# Patient Record
Sex: Female | Born: 1935 | Race: White | Hispanic: No
Health system: Southern US, Community
[De-identification: ages and names within clinical notes are randomized; demographics above are authoritative.]

---

## 2002-03-22 ENCOUNTER — Encounter: Admission: RE | Admit: 2002-03-22 | Discharge: 2002-03-22 | Payer: Self-pay | Admitting: Neurosurgery

## 2002-03-22 ENCOUNTER — Encounter: Payer: Self-pay | Admitting: Neurosurgery

## 2004-04-19 ENCOUNTER — Ambulatory Visit: Payer: Self-pay | Admitting: Family Medicine

## 2004-09-13 ENCOUNTER — Ambulatory Visit: Payer: Self-pay | Admitting: General Practice

## 2004-10-13 ENCOUNTER — Ambulatory Visit: Payer: Self-pay | Admitting: General Practice

## 2004-11-09 ENCOUNTER — Encounter: Payer: Self-pay | Admitting: General Practice

## 2004-11-20 ENCOUNTER — Encounter: Payer: Self-pay | Admitting: General Practice

## 2004-12-21 ENCOUNTER — Encounter: Payer: Self-pay | Admitting: General Practice

## 2005-01-21 ENCOUNTER — Encounter: Payer: Self-pay | Admitting: General Practice

## 2005-07-05 ENCOUNTER — Ambulatory Visit: Payer: Self-pay | Admitting: Family Medicine

## 2005-09-21 ENCOUNTER — Ambulatory Visit: Payer: Self-pay | Admitting: Family Medicine

## 2006-07-17 ENCOUNTER — Ambulatory Visit: Payer: Self-pay | Admitting: Family Medicine

## 2006-11-08 ENCOUNTER — Ambulatory Visit: Payer: Self-pay | Admitting: Family Medicine

## 2007-08-27 ENCOUNTER — Ambulatory Visit: Payer: Self-pay | Admitting: Family Medicine

## 2008-01-03 ENCOUNTER — Ambulatory Visit: Payer: Self-pay | Admitting: Specialist

## 2008-05-06 ENCOUNTER — Ambulatory Visit: Payer: Self-pay | Admitting: Specialist

## 2008-08-12 ENCOUNTER — Ambulatory Visit: Payer: Self-pay | Admitting: Vascular Surgery

## 2008-11-01 ENCOUNTER — Ambulatory Visit: Payer: Self-pay | Admitting: Internal Medicine

## 2008-11-17 ENCOUNTER — Ambulatory Visit: Payer: Self-pay | Admitting: Family Medicine

## 2009-07-18 ENCOUNTER — Ambulatory Visit: Payer: Self-pay | Admitting: Family Medicine

## 2009-08-04 ENCOUNTER — Ambulatory Visit: Payer: Self-pay | Admitting: Family Medicine

## 2010-02-03 ENCOUNTER — Ambulatory Visit: Payer: Self-pay | Admitting: Geriatric Medicine

## 2010-02-12 ENCOUNTER — Ambulatory Visit: Payer: Self-pay | Admitting: Gastroenterology

## 2011-02-01 ENCOUNTER — Ambulatory Visit: Payer: Self-pay | Admitting: Gastroenterology

## 2011-02-08 ENCOUNTER — Ambulatory Visit: Payer: Self-pay | Admitting: Geriatric Medicine

## 2011-02-11 ENCOUNTER — Emergency Department: Payer: Self-pay | Admitting: Internal Medicine

## 2011-02-18 ENCOUNTER — Emergency Department: Payer: Self-pay | Admitting: *Deleted

## 2011-04-24 ENCOUNTER — Emergency Department: Payer: Self-pay | Admitting: Emergency Medicine

## 2011-05-08 ENCOUNTER — Emergency Department: Payer: Self-pay | Admitting: Emergency Medicine

## 2011-05-13 ENCOUNTER — Emergency Department: Payer: Self-pay | Admitting: *Deleted

## 2011-06-05 ENCOUNTER — Inpatient Hospital Stay: Payer: Self-pay | Admitting: Internal Medicine

## 2011-06-05 LAB — COMPREHENSIVE METABOLIC PANEL
Albumin: 3.4 g/dL (ref 3.4–5.0)
Alkaline Phosphatase: 63 U/L (ref 50–136)
Anion Gap: 9 (ref 7–16)
BUN: 10 mg/dL (ref 7–18)
Bilirubin,Total: 0.3 mg/dL (ref 0.2–1.0)
Calcium, Total: 8.4 mg/dL — ABNORMAL LOW (ref 8.5–10.1)
Co2: 29 mmol/L (ref 21–32)
Creatinine: 0.85 mg/dL (ref 0.60–1.30)
EGFR (Non-African Amer.): >60
Glucose: 115 mg/dL — ABNORMAL HIGH (ref 65–99)
Osmolality: 287 (ref 275–301)
Potassium: 3.7 mmol/L (ref 3.5–5.1)
Sodium: 144 mmol/L (ref 136–145)
Total Protein: 6.4 g/dL (ref 6.4–8.2)

## 2011-06-05 LAB — TROPONIN I: Troponin-I: <0.02 ng/mL

## 2011-06-05 LAB — CBC
HCT: 33.9 % — ABNORMAL LOW (ref 35.0–47.0)
MCHC: 33.4 g/dL (ref 32.0–36.0)
Platelet: 187 10*3/uL (ref 150–440)
RDW: 15.5 % — ABNORMAL HIGH (ref 11.5–14.5)
WBC: 4.1 10*3/uL (ref 3.6–11.0)

## 2011-06-05 LAB — CK TOTAL AND CKMB (NOT AT ARMC): CK, Total: 54 U/L (ref 21–215)

## 2011-06-06 LAB — CBC WITH DIFFERENTIAL/PLATELET
Basophil #: 0 10*3/uL (ref 0.0–0.1)
Basophil %: 0.1 %
HCT: 34.6 % — ABNORMAL LOW (ref 35.0–47.0)
HGB: 11.2 g/dL — ABNORMAL LOW (ref 12.0–16.0)
Lymphocyte #: 0.6 10*3/uL — ABNORMAL LOW (ref 1.0–3.6)
Lymphocyte %: 17.4 %
MCV: 99 fL (ref 80–100)
Monocyte %: 1.2 %
Neutrophil #: 3 10*3/uL (ref 1.4–6.5)
RBC: 3.52 10*6/uL — ABNORMAL LOW (ref 3.80–5.20)
WBC: 3.7 10*3/uL (ref 3.6–11.0)

## 2011-06-06 LAB — BASIC METABOLIC PANEL
Calcium, Total: 8.6 mg/dL (ref 8.5–10.1)
Chloride: 106 mmol/L (ref 98–107)
Osmolality: 295 (ref 275–301)
Potassium: 4.6 mmol/L (ref 3.5–5.1)
Sodium: 146 mmol/L — ABNORMAL HIGH (ref 136–145)

## 2011-06-06 LAB — CK-MB: CK-MB: 2 ng/mL (ref 0.5–3.6)

## 2011-06-07 LAB — SODIUM: Sodium: 144 mmol/L (ref 136–145)

## 2011-06-07 LAB — HEMOGLOBIN: HGB: 10.5 g/dL — ABNORMAL LOW (ref 12.0–16.0)

## 2011-06-08 LAB — HEMOGLOBIN: HGB: 11.1 g/dL — ABNORMAL LOW (ref 12.0–16.0)

## 2011-06-08 LAB — HEMOGLOBIN A1C: Hemoglobin A1C: 6.1 % (ref 4.2–6.3)

## 2011-06-11 LAB — CULTURE, BLOOD (SINGLE)

## 2011-06-22 ENCOUNTER — Ambulatory Visit: Payer: Self-pay | Admitting: Family Medicine

## 2011-06-29 ENCOUNTER — Emergency Department: Payer: Self-pay | Admitting: Emergency Medicine

## 2011-06-29 LAB — CBC
HCT: 39 % (ref 35.0–47.0)
MCH: 32.6 pg (ref 26.0–34.0)
MCV: 99 fL (ref 80–100)
Platelet: 239 10*3/uL (ref 150–440)
RBC: 3.95 10*6/uL (ref 3.80–5.20)

## 2011-06-29 LAB — COMPREHENSIVE METABOLIC PANEL
Alkaline Phosphatase: 52 U/L (ref 50–136)
Anion Gap: 9 (ref 7–16)
BUN: 11 mg/dL (ref 7–18)
Bilirubin,Total: 0.4 mg/dL (ref 0.2–1.0)
Calcium, Total: 8.8 mg/dL (ref 8.5–10.1)
Co2: 32 mmol/L (ref 21–32)
EGFR (African American): >60
EGFR (Non-African Amer.): 56 — ABNORMAL LOW
Glucose: 88 mg/dL (ref 65–99)
Potassium: 4.1 mmol/L (ref 3.5–5.1)
SGOT(AST): 19 U/L (ref 15–37)
SGPT (ALT): 17 U/L

## 2011-08-17 ENCOUNTER — Emergency Department: Payer: Self-pay | Admitting: Emergency Medicine

## 2012-02-06 ENCOUNTER — Emergency Department: Payer: Self-pay | Admitting: Emergency Medicine

## 2012-02-06 LAB — URINALYSIS, COMPLETE
Bilirubin,UR: NEGATIVE
Glucose,UR: NEGATIVE mg/dL (ref 0–75)
Ketone: NEGATIVE
Nitrite: NEGATIVE
Ph: 6 (ref 4.5–8.0)
Protein: 100
RBC,UR: 63 /HPF (ref 0–5)
Specific Gravity: 1.006 (ref 1.003–1.030)
Squamous Epithelial: 10
WBC UR: 7190 /HPF (ref 0–5)

## 2012-02-15 ENCOUNTER — Emergency Department: Payer: Self-pay | Admitting: Emergency Medicine

## 2012-03-07 ENCOUNTER — Emergency Department: Payer: Self-pay | Admitting: Emergency Medicine

## 2012-03-07 LAB — COMPREHENSIVE METABOLIC PANEL
Albumin: 2.2 g/dL — ABNORMAL LOW (ref 3.4–5.0)
Alkaline Phosphatase: 137 U/L — ABNORMAL HIGH (ref 50–136)
Anion Gap: 10 (ref 7–16)
BUN: 9 mg/dL (ref 7–18)
Bilirubin,Total: 0.3 mg/dL (ref 0.2–1.0)
Calcium, Total: 8.5 mg/dL (ref 8.5–10.1)
Chloride: 99 mmol/L (ref 98–107)
Co2: 30 mmol/L (ref 21–32)
Creatinine: 0.91 mg/dL (ref 0.60–1.30)
EGFR (African American): >60
EGFR (Non-African Amer.): >60
Glucose: 105 mg/dL — ABNORMAL HIGH (ref 65–99)
Osmolality: 277 (ref 275–301)
Potassium: 3.6 mmol/L (ref 3.5–5.1)
SGOT(AST): 26 U/L (ref 15–37)
SGPT (ALT): 21 U/L (ref 12–78)
Sodium: 139 mmol/L (ref 136–145)
Total Protein: 6.9 g/dL (ref 6.4–8.2)

## 2012-03-07 LAB — CK TOTAL AND CKMB (NOT AT ARMC)
CK, Total: 20 U/L — ABNORMAL LOW (ref 21–215)
CK-MB: <0.5 ng/mL — ABNORMAL LOW (ref 0.5–3.6)

## 2012-03-07 LAB — CBC
HCT: 31.3 % — ABNORMAL LOW (ref 35.0–47.0)
HGB: 10.7 g/dL — ABNORMAL LOW (ref 12.0–16.0)
MCH: 32 pg (ref 26.0–34.0)
MCHC: 34.3 g/dL (ref 32.0–36.0)
MCV: 93 fL (ref 80–100)
Platelet: 505 10*3/uL — ABNORMAL HIGH (ref 150–440)
RBC: 3.36 10*6/uL — ABNORMAL LOW (ref 3.80–5.20)
RDW: 13.8 % (ref 11.5–14.5)
WBC: 6.2 10*3/uL (ref 3.6–11.0)

## 2012-03-07 LAB — URINALYSIS, COMPLETE
Bilirubin,UR: NEGATIVE
Glucose,UR: NEGATIVE mg/dL (ref 0–75)
Hyaline Cast: 2
Ketone: NEGATIVE
Nitrite: NEGATIVE
Ph: 6 (ref 4.5–8.0)
Protein: NEGATIVE
RBC,UR: 7 /HPF (ref 0–5)
Specific Gravity: 1.009 (ref 1.003–1.030)
Squamous Epithelial: 4
WBC UR: 702 /HPF (ref 0–5)

## 2012-03-07 LAB — PRO B NATRIURETIC PEPTIDE: B-Type Natriuretic Peptide: 1361 pg/mL — ABNORMAL HIGH (ref 0–450)

## 2012-03-07 LAB — TROPONIN I: Troponin-I: <0.02 ng/mL

## 2012-03-09 LAB — URINE CULTURE

## 2012-03-13 LAB — CULTURE, BLOOD (SINGLE)

## 2012-05-02 ENCOUNTER — Emergency Department: Payer: Self-pay | Admitting: Internal Medicine

## 2012-05-28 ENCOUNTER — Ambulatory Visit: Payer: Self-pay | Admitting: Pain Medicine

## 2012-05-31 ENCOUNTER — Ambulatory Visit: Payer: Self-pay | Admitting: Pain Medicine

## 2012-06-18 ENCOUNTER — Ambulatory Visit: Payer: Self-pay | Admitting: Pain Medicine

## 2012-06-19 ENCOUNTER — Emergency Department: Payer: Self-pay | Admitting: Emergency Medicine

## 2012-06-26 ENCOUNTER — Ambulatory Visit: Payer: Self-pay | Admitting: Pain Medicine

## 2012-07-19 ENCOUNTER — Ambulatory Visit: Payer: Self-pay | Admitting: Pain Medicine

## 2012-08-16 IMAGING — CT CT HEAD WITHOUT CONTRAST
2 series · 15 of 30 positions shown, 19 images · non-contrast
Comparison: none

REASON FOR EXAM: FALL  HIT HEAD
COMMENTS:   May transport without cardiac monitor

PROCEDURE:     CT  - CT HEAD WITHOUT CONTRAST  - February 18, 2011  [DATE]
RESULT:     Comparison:  02/11/2011
TECHNIQUE: Multiple axial images from the foramen magnum to the vertex were
obtained without IV contrast.

[Series 2: without · axial · non-contrast · 0.43mm/px · z∈[-200,-75]mm · 13 of 31 slices shown, 17 images]
[im 3/31  brain]
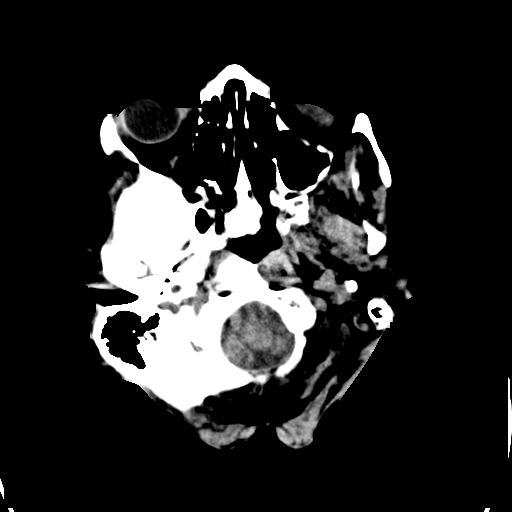
[im 3/31  bone]
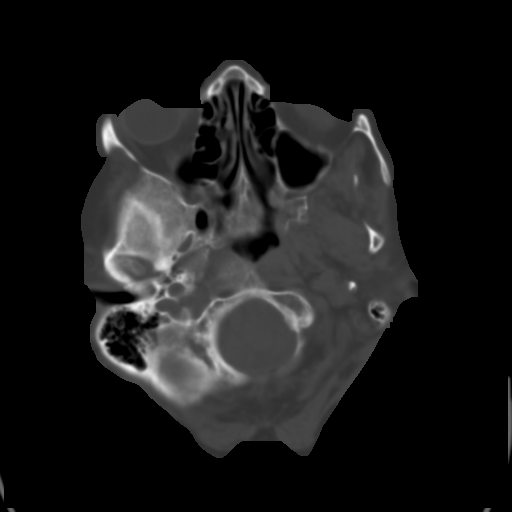
[im 5/31  brain]
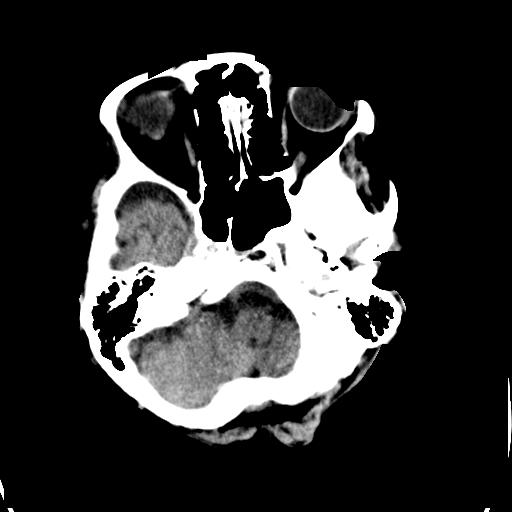
[im 7/31  brain]
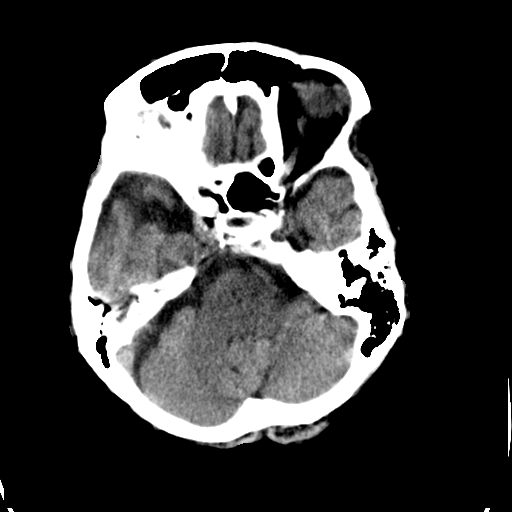
[im 9/31  brain]
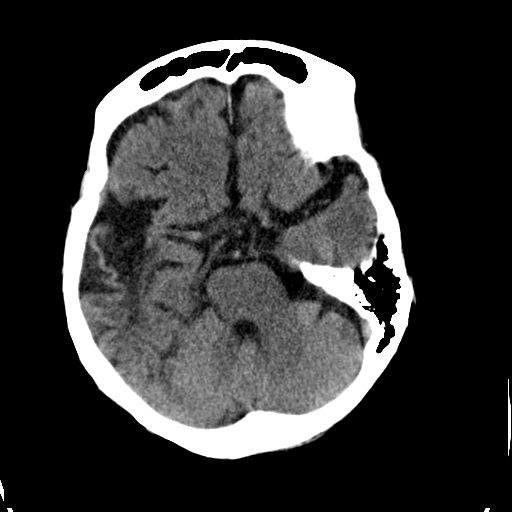
[im 11/31  brain]
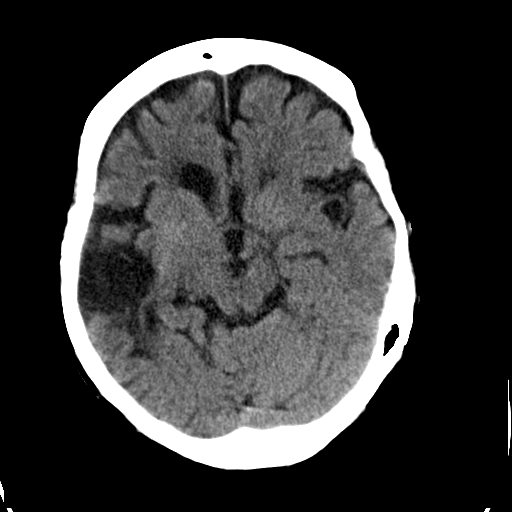
[im 11/31  bone]
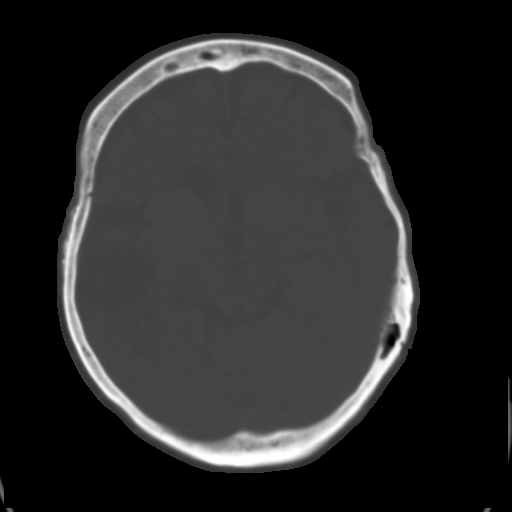
[im 13/31  brain]
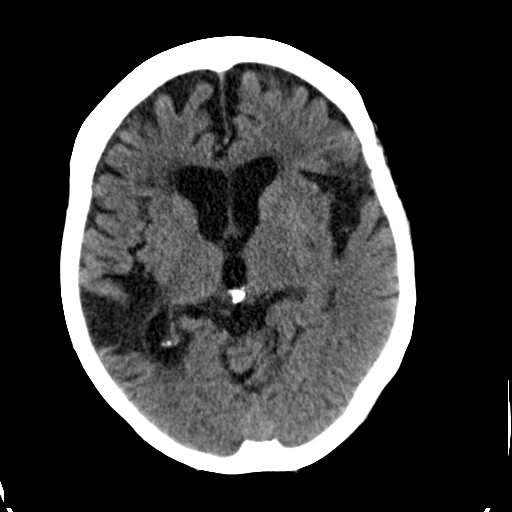
[im 16/31  brain]
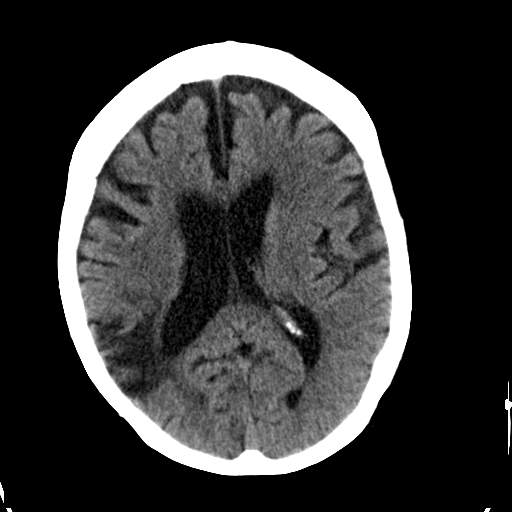
[im 18/31  brain]
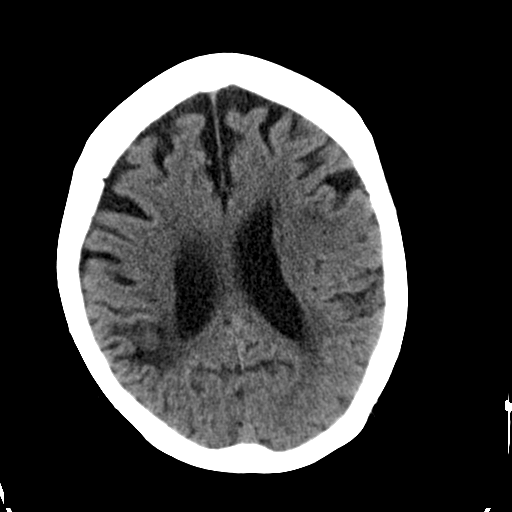
[im 20/31  brain]
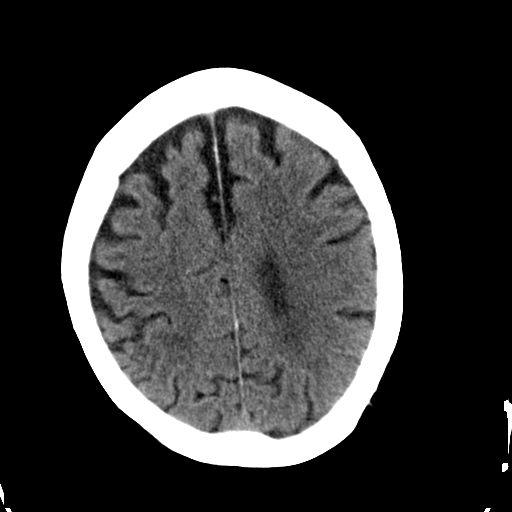
[im 20/31  bone]
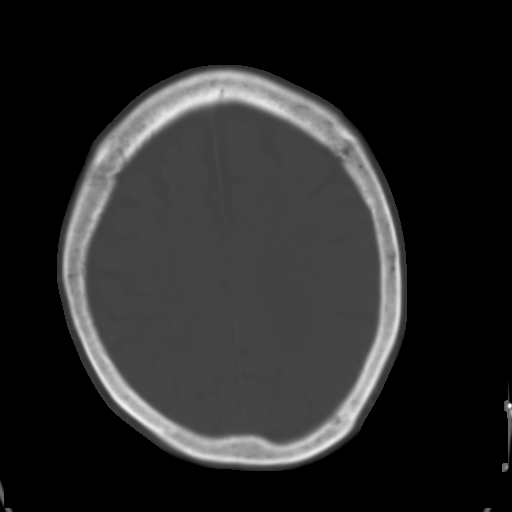
[im 22/31  brain]
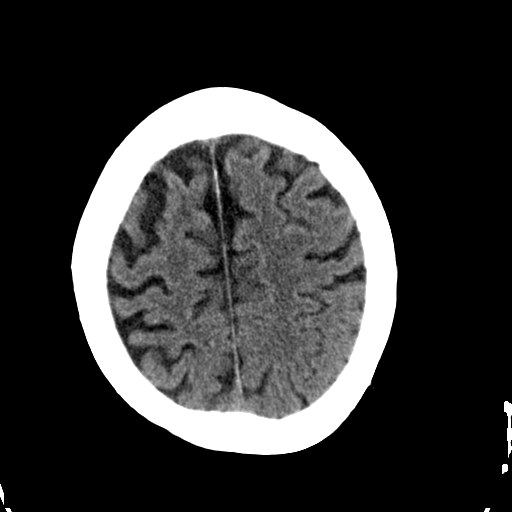
[im 24/31  brain]
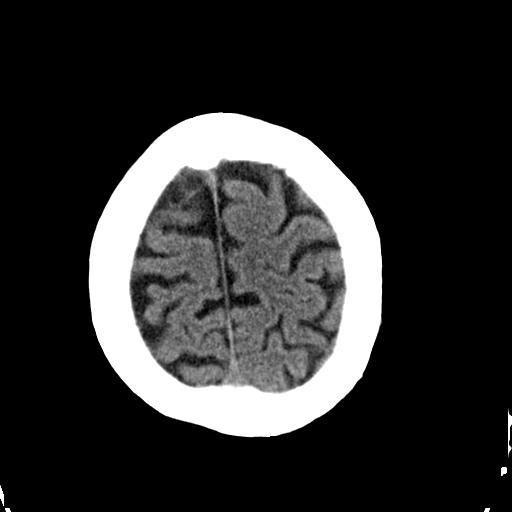
[im 26/31  brain]
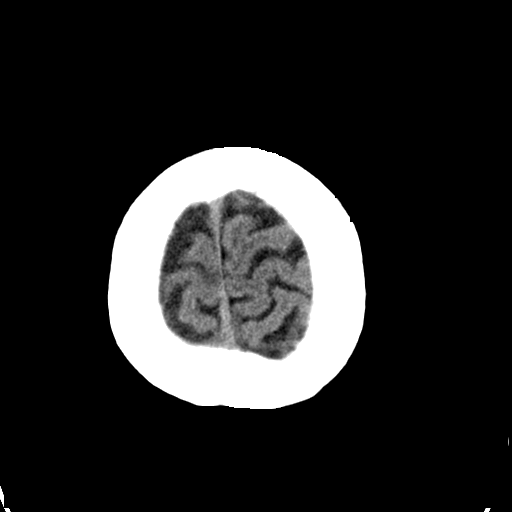
[im 28/31  brain]
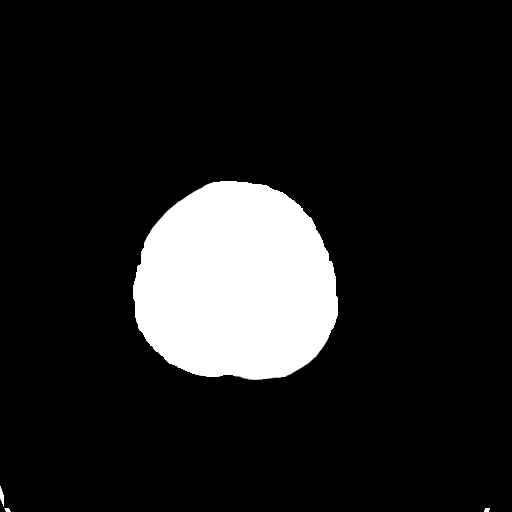
[im 28/31  bone]
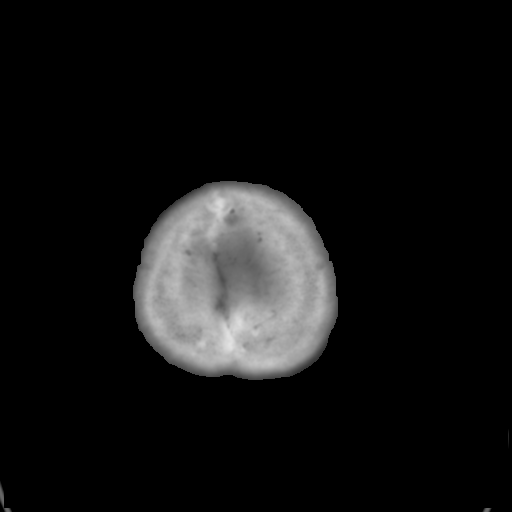

[Series 3: bone · axial · 0.43mm/px · z∈[-200,-180]mm · 2 of 31 slices shown]
[im 3/31  bone]
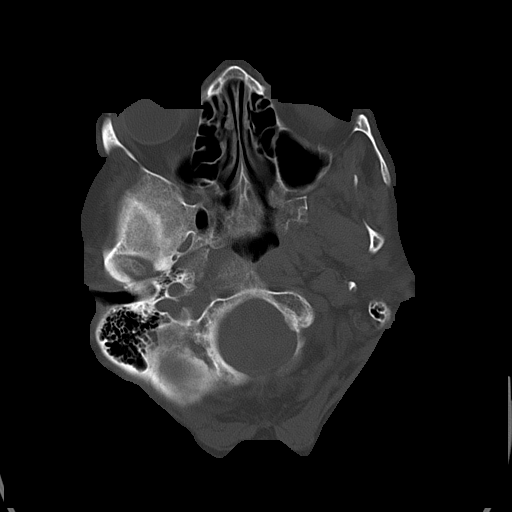
[im 7/31  bone]
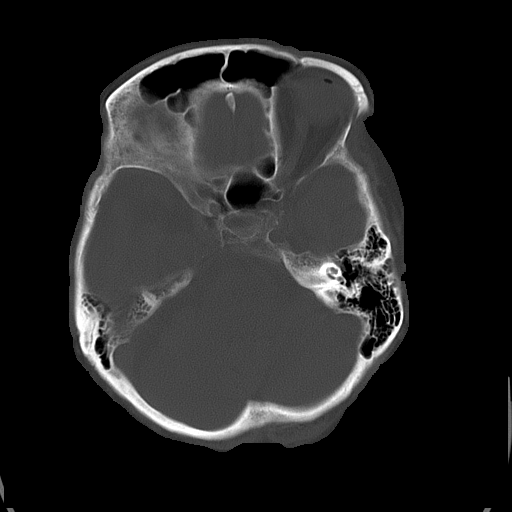

[15 of 30 positions shown; findings below may reference images not displayed]

FINDINGS: There is no evidence of mass effect, midline shift, or extra-axial fluid
collections.  There is no evidence of a space-occupying lesion or
intracranial hemorrhage. There is no evidence of a cortical-based area of
acute infarction. There is an old right temporoparietal infarct with
encephalomalacia. There is an old left basal ganglia lacunar infarct. There
is generalized cerebral atrophy. There is periventricular white matter low
attenuation likely secondary to microangiopathy.

The ventricles and sulci are appropriate for the patient's age. The basal
cisterns are patent.

Visualized portions of the orbits are unremarkable. The visualized portions
of the paranasal sinuses and mastoid air cells are unremarkable.

The osseous structures are unremarkable.
IMPRESSION: No acute intracranial process.

## 2012-12-02 IMAGING — CR DG CHEST 2V
1 series · 2 of 2 positions shown · non-contrast
Comparison: none

REASON FOR EXAM: SOB
COMMENTS:

PROCEDURE:     DXR - DXR CHEST PA (OR AP) AND LATERAL  - June 06, 2011  [DATE]
RESULT:     Comparison: 06/05/2011

[Series 1: ap · 0.17mm/px · 2 of 2 slices shown]
[im 1/2]
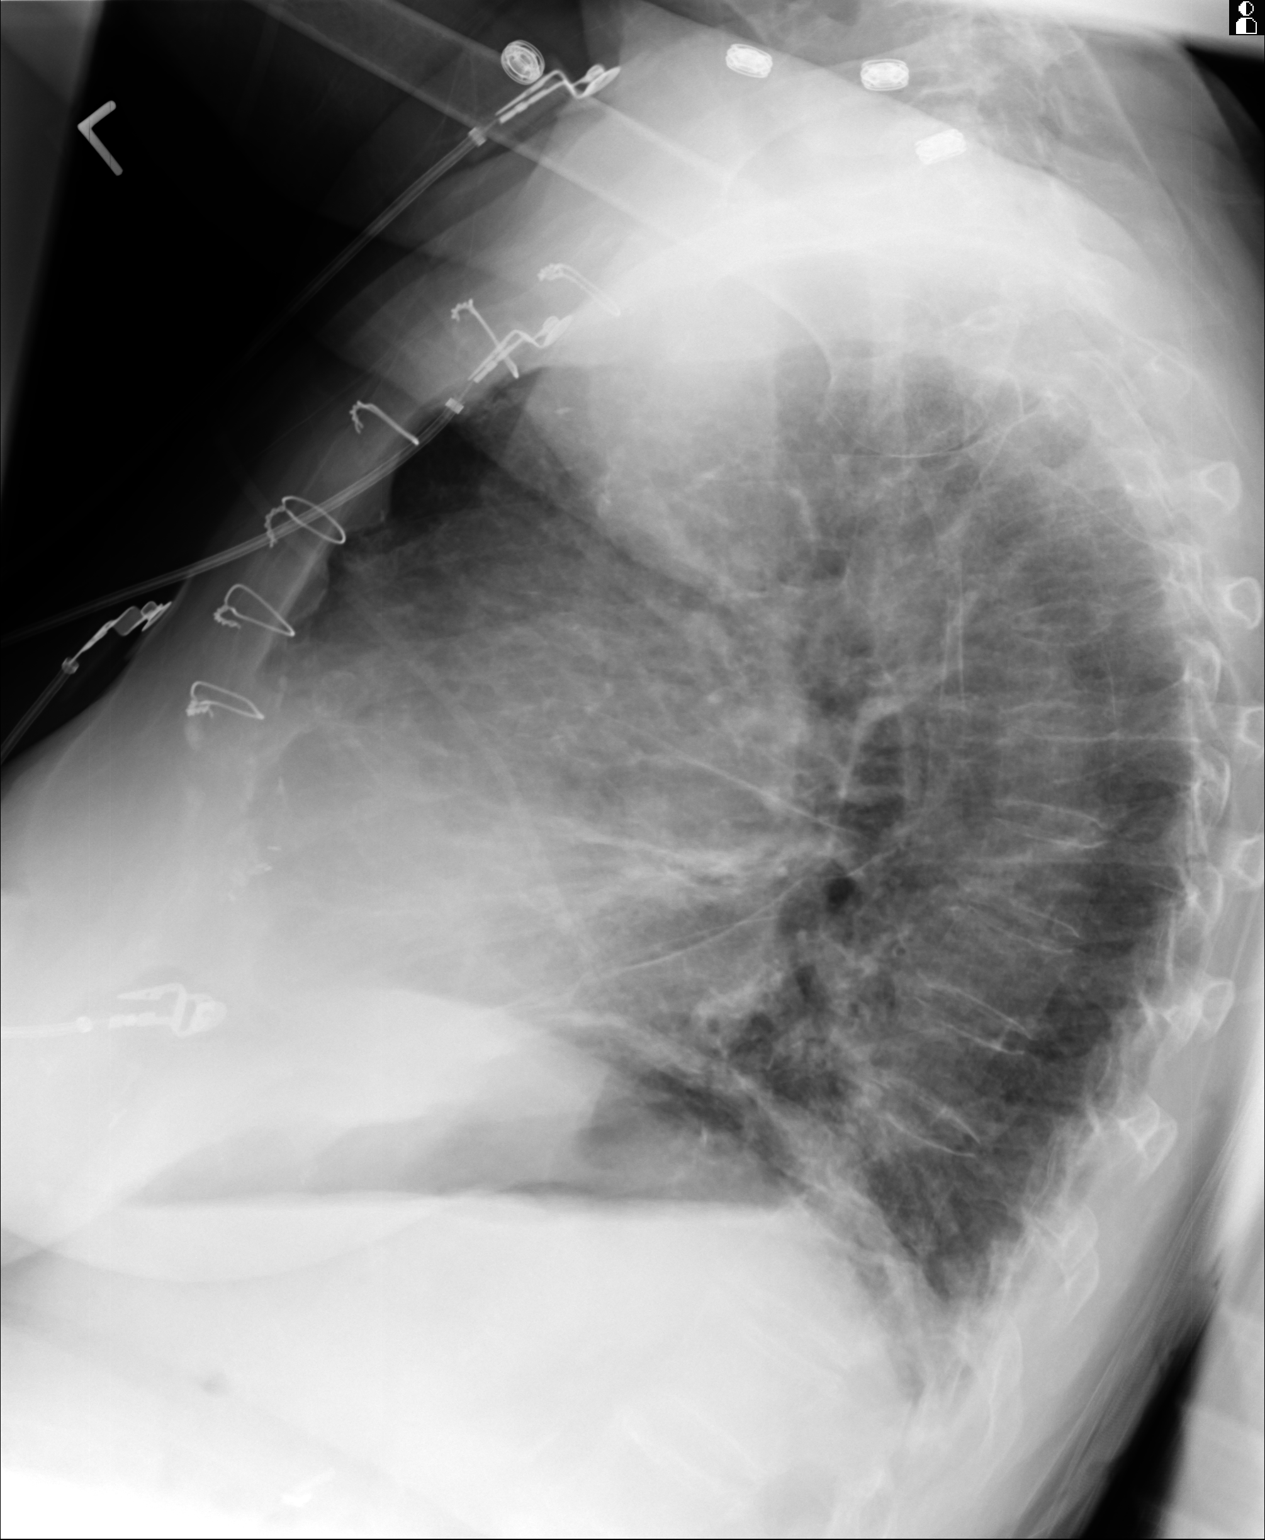
[im 2/2]
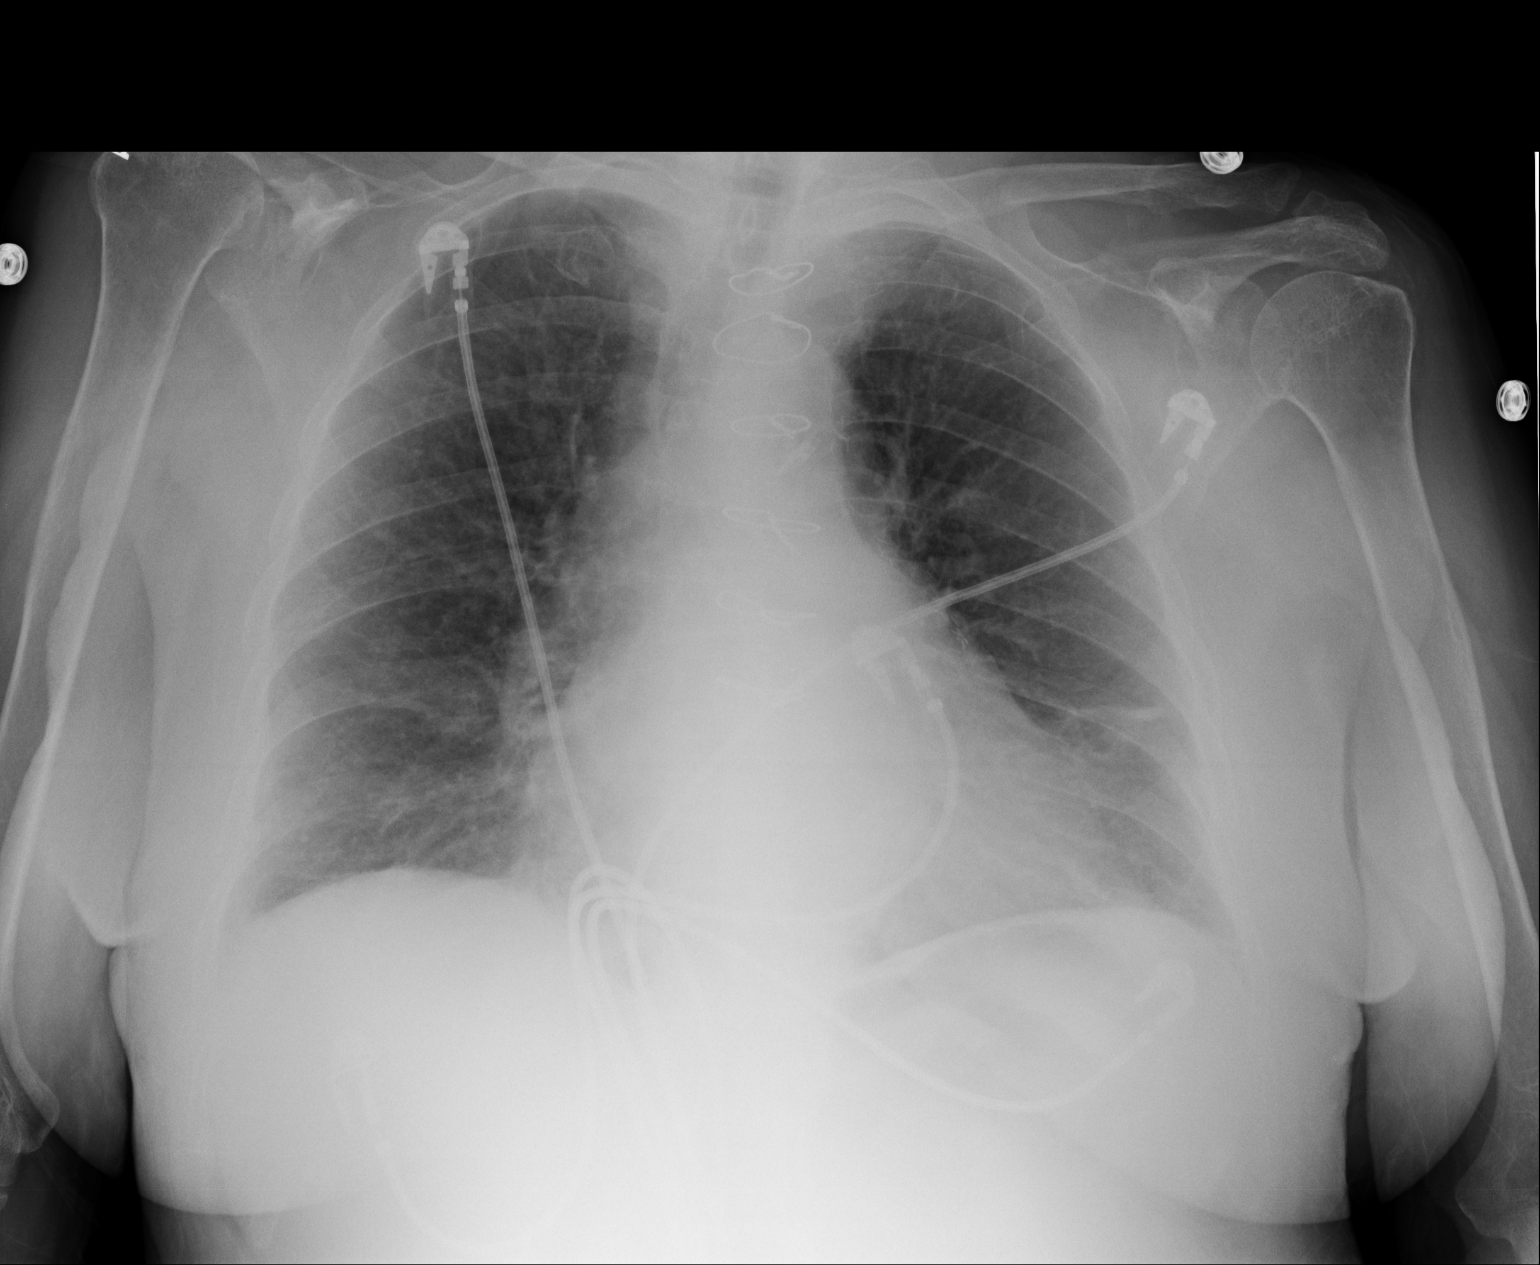

[2 of 2 positions shown; findings below may reference images not displayed]

FINDINGS: PA and lateral chest radiographs are provided. There is mild bilateral
interstitial prominence. There is no focal parenchymal opacity, pleural
effusion, or pneumothorax. The heart and mediastinum are unremarkable.  The
osseous structures are unremarkable.
IMPRESSION: No acute disease of the chest.

## 2012-12-03 ENCOUNTER — Ambulatory Visit: Payer: Self-pay

## 2012-12-25 IMAGING — CR DG CHEST 2V
1 series · 2 of 2 positions shown · non-contrast
Comparison: none

REASON FOR EXAM: SOB
COMMENTS:

PROCEDURE:     DXR - DXR CHEST PA (OR AP) AND LATERAL  - June 29, 2011  [DATE]
RESULT:     Comparison: None

[Series 1: w chest pa · 0.14mm/px · 2 of 2 slices shown]
[im 1/2]
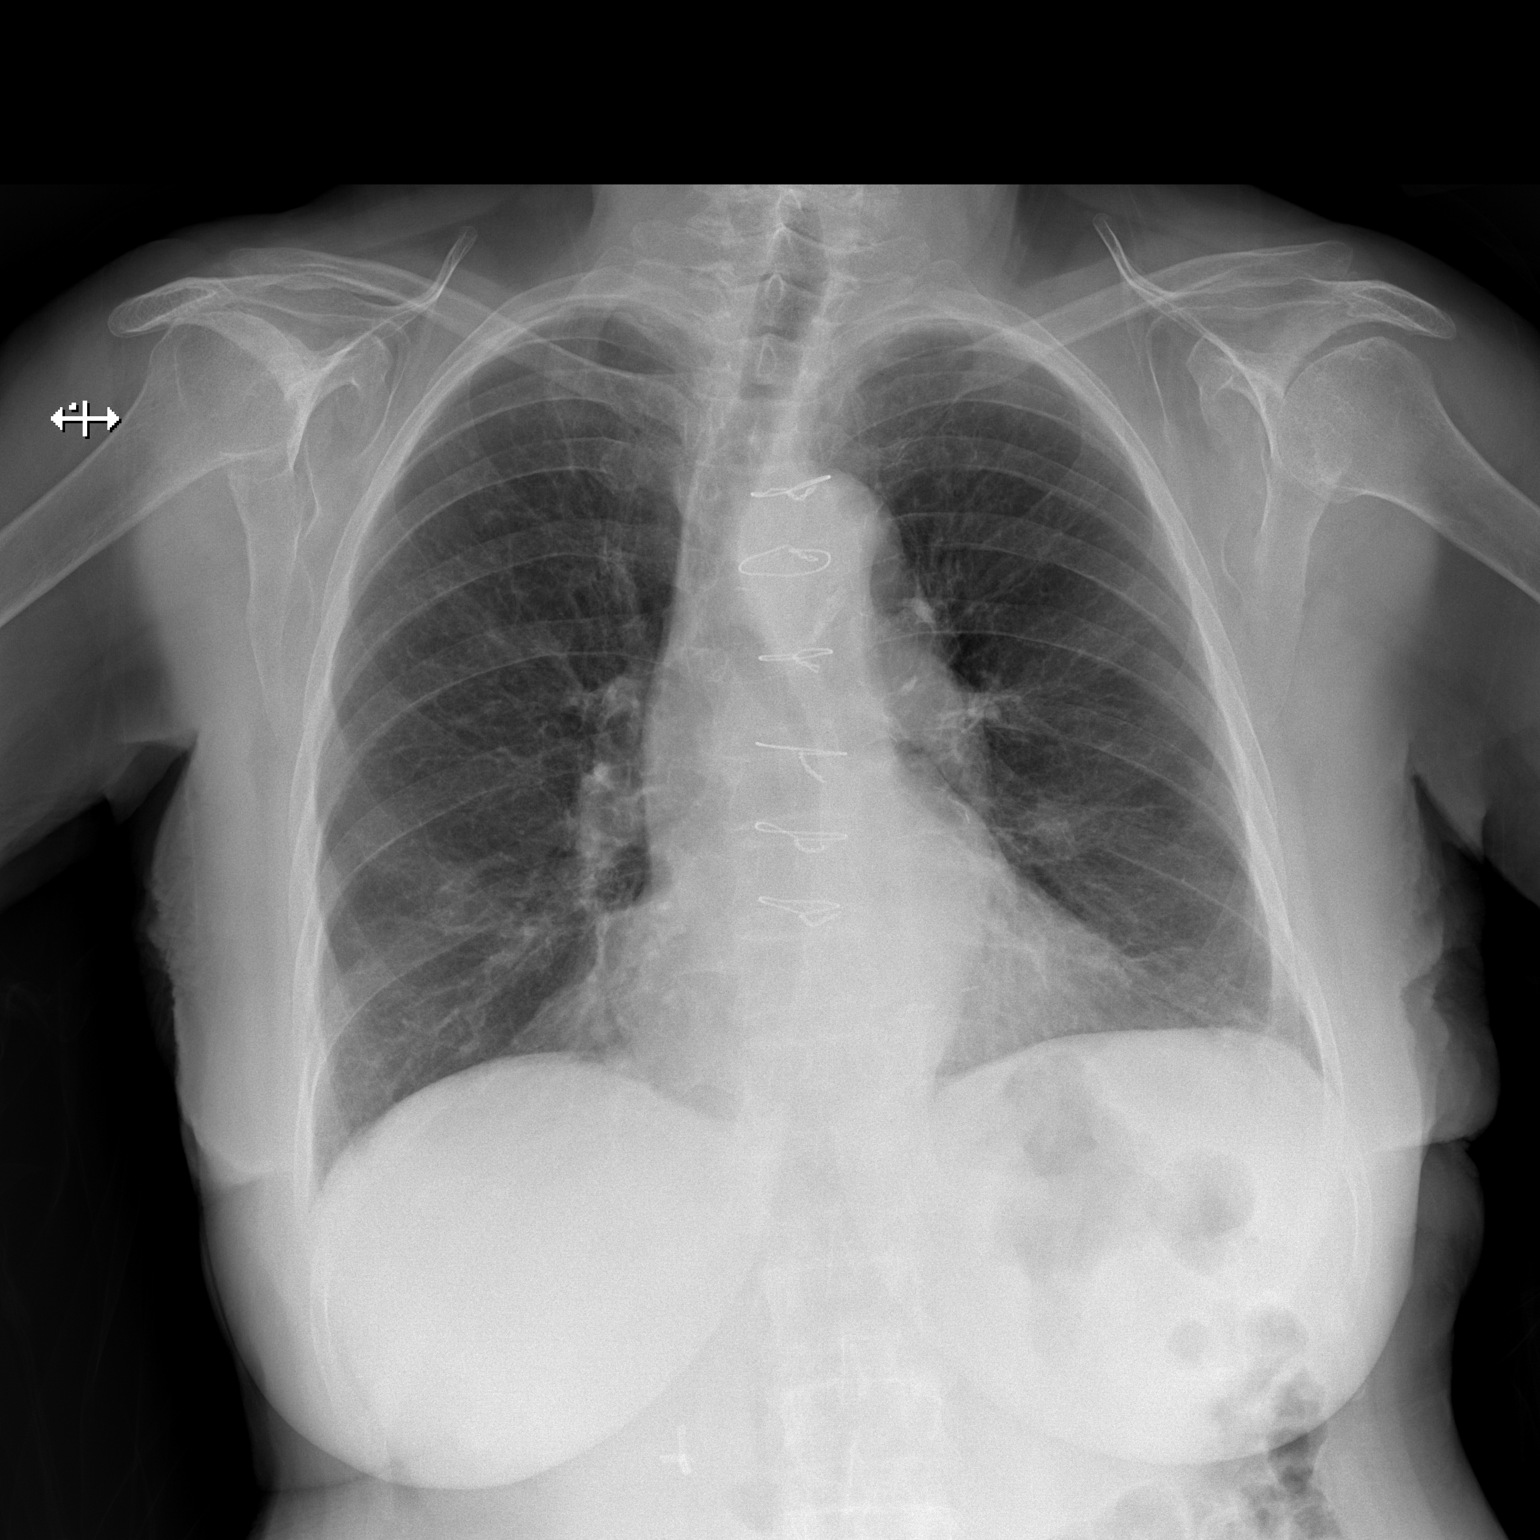
[im 2/2]
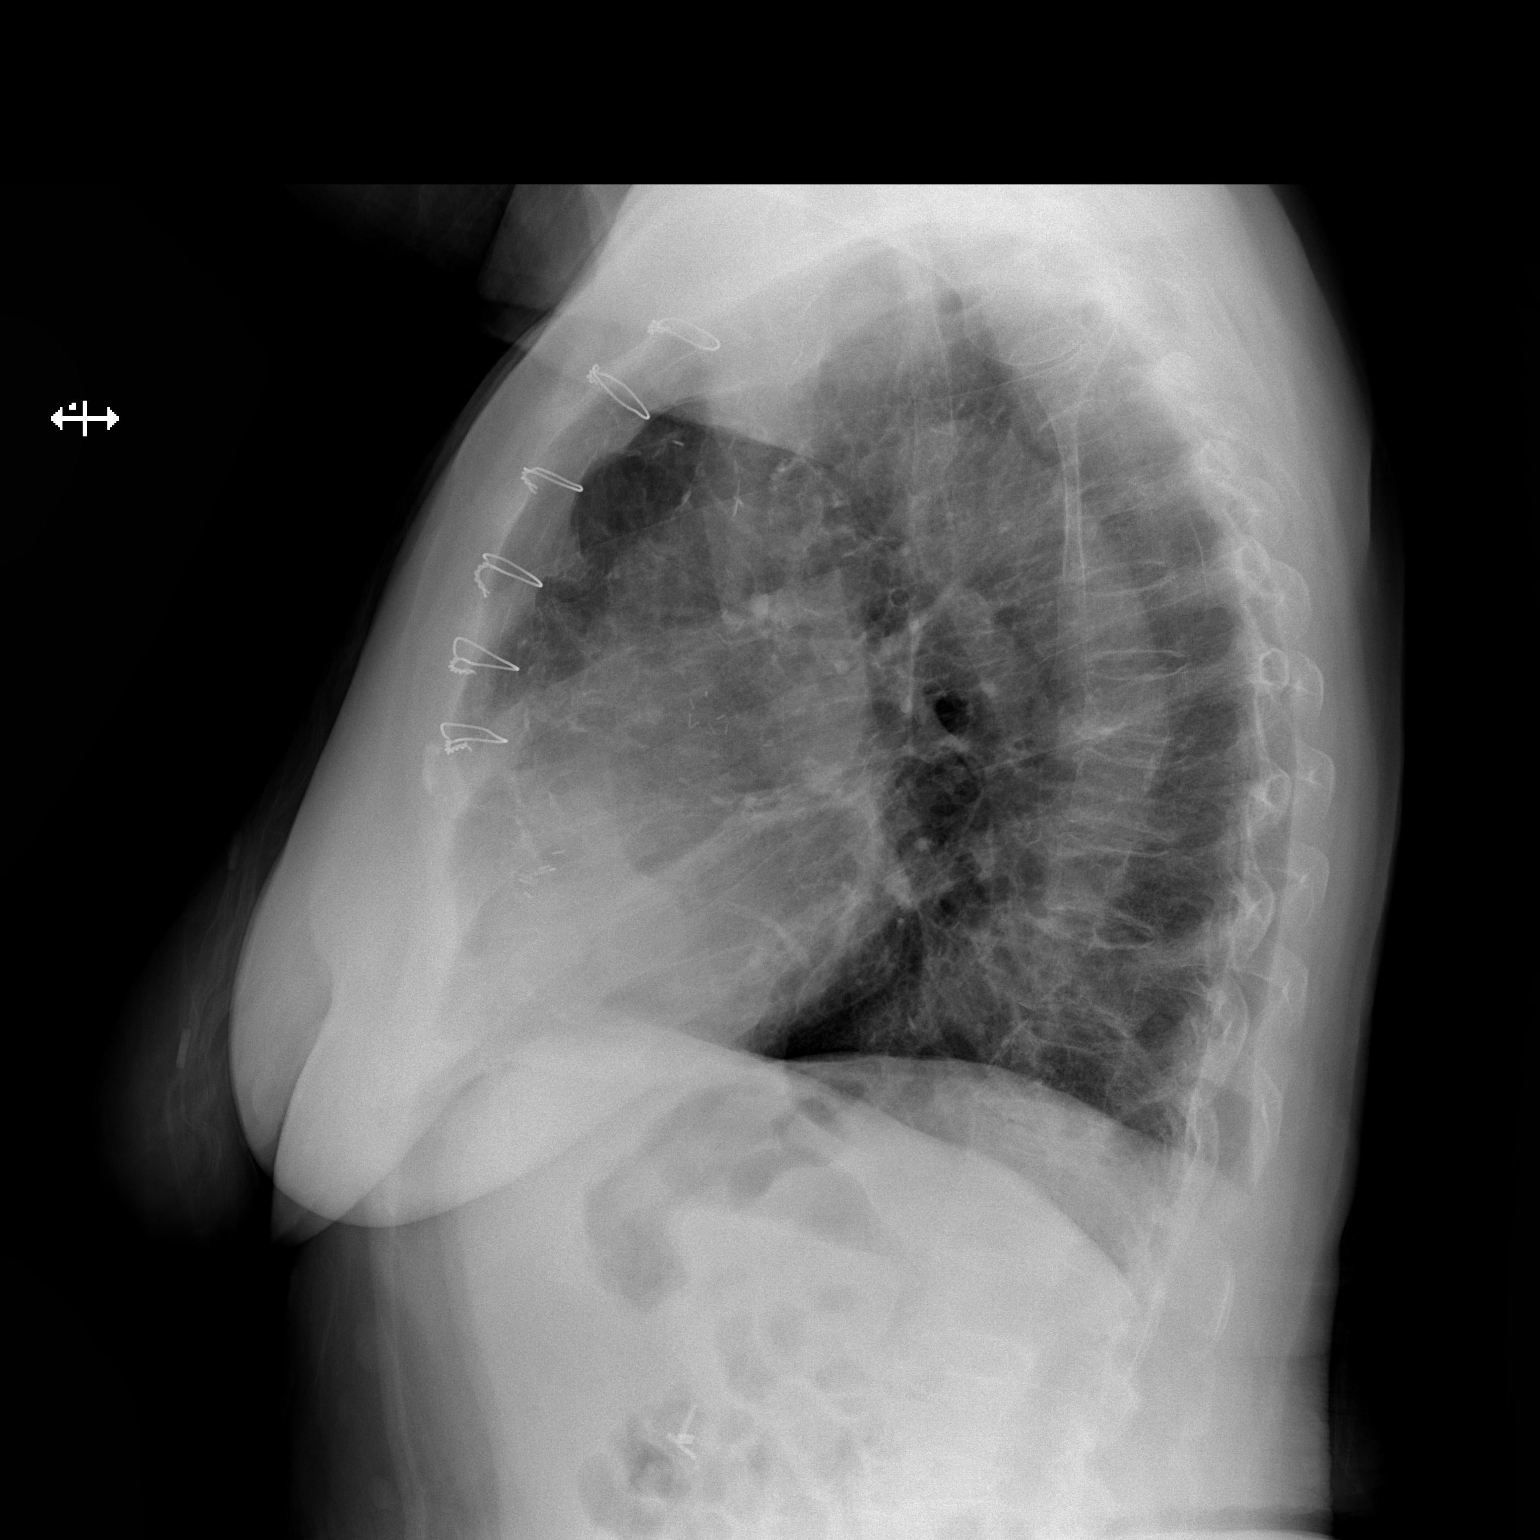

[2 of 2 positions shown; findings below may reference images not displayed]

FINDINGS: PA and lateral chest radiographs are provided.  There is no focal
parenchymal opacity, pleural effusion, or pneumothorax. The heart and
mediastinum are unremarkable. Prior median sternotomy. The osseous
structures are unremarkable.
IMPRESSION: No acute disease of the che[REDACTED]

## 2013-04-19 ENCOUNTER — Emergency Department: Payer: Self-pay | Admitting: Emergency Medicine

## 2013-04-19 LAB — CBC
HCT: 35.9 % (ref 35.0–47.0)
MCH: 31.8 pg (ref 26.0–34.0)
MCHC: 34 g/dL (ref 32.0–36.0)
MCV: 94 fL (ref 80–100)
Platelet: 319 10*3/uL (ref 150–440)
WBC: 5.4 10*3/uL (ref 3.6–11.0)

## 2013-04-19 LAB — COMPREHENSIVE METABOLIC PANEL
Albumin: 2.9 g/dL — ABNORMAL LOW (ref 3.4–5.0)
Alkaline Phosphatase: 89 U/L
Anion Gap: 4 — ABNORMAL LOW (ref 7–16)
Bilirubin,Total: 0.2 mg/dL (ref 0.2–1.0)
Chloride: 102 mmol/L (ref 98–107)
Creatinine: 1.06 mg/dL (ref 0.60–1.30)
EGFR (Non-African Amer.): 50 — ABNORMAL LOW
Osmolality: 276 (ref 275–301)
SGOT(AST): 14 U/L — ABNORMAL LOW (ref 15–37)
Total Protein: 7 g/dL (ref 6.4–8.2)

## 2013-04-19 LAB — TROPONIN I: Troponin-I: <0.02 ng/mL

## 2013-04-19 LAB — CK TOTAL AND CKMB (NOT AT ARMC): CK, Total: 40 U/L (ref 21–215)

## 2013-05-05 ENCOUNTER — Emergency Department: Payer: Self-pay | Admitting: Emergency Medicine

## 2013-05-05 LAB — URINALYSIS, COMPLETE
Bilirubin,UR: NEGATIVE
Glucose,UR: NEGATIVE mg/dL (ref 0–75)
Ketone: NEGATIVE
Nitrite: POSITIVE
Ph: 6 (ref 4.5–8.0)
Protein: NEGATIVE
RBC,UR: 6 /HPF (ref 0–5)
Specific Gravity: 1.008 (ref 1.003–1.030)
Squamous Epithelial: <1
WBC UR: 691 /HPF (ref 0–5)

## 2013-05-05 LAB — CBC
HCT: 38.3 % (ref 35.0–47.0)
HGB: 12.6 g/dL (ref 12.0–16.0)
MCHC: 32.8 g/dL (ref 32.0–36.0)
MCV: 94 fL (ref 80–100)
RBC: 4.09 10*6/uL (ref 3.80–5.20)
RDW: 13.8 % (ref 11.5–14.5)
WBC: 4.9 10*3/uL (ref 3.6–11.0)

## 2013-05-05 LAB — COMPREHENSIVE METABOLIC PANEL
Albumin: 2.9 g/dL — ABNORMAL LOW (ref 3.4–5.0)
Alkaline Phosphatase: 78 U/L
Anion Gap: 0 — ABNORMAL LOW (ref 7–16)
Chloride: 100 mmol/L (ref 98–107)
Creatinine: 1.15 mg/dL (ref 0.60–1.30)
EGFR (African American): 52 — ABNORMAL LOW
EGFR (Non-African Amer.): 45 — ABNORMAL LOW
Osmolality: 270 (ref 275–301)
Potassium: 4.5 mmol/L (ref 3.5–5.1)
Sodium: 136 mmol/L (ref 136–145)

## 2013-05-05 LAB — TSH: Thyroid Stimulating Horm: 1.81 u[IU]/mL

## 2013-05-05 LAB — MAGNESIUM: Magnesium: 1.8 mg/dL

## 2013-07-23 ENCOUNTER — Emergency Department: Payer: Self-pay | Admitting: Emergency Medicine

## 2013-07-23 LAB — BASIC METABOLIC PANEL
Anion Gap: 5 — ABNORMAL LOW (ref 7–16)
BUN: 18 mg/dL (ref 7–18)
Calcium, Total: 8.6 mg/dL (ref 8.5–10.1)
Chloride: 103 mmol/L (ref 98–107)
Co2: 31 mmol/L (ref 21–32)
Creatinine: 1.54 mg/dL — ABNORMAL HIGH (ref 0.60–1.30)
GFR CALC AF AMER: 37 — AB
GFR CALC NON AF AMER: 32 — AB
Glucose: 119 mg/dL — ABNORMAL HIGH (ref 65–99)
Osmolality: 281 (ref 275–301)
POTASSIUM: 4.3 mmol/L (ref 3.5–5.1)
SODIUM: 139 mmol/L (ref 136–145)

## 2013-07-23 LAB — CBC
HCT: 38.1 % (ref 35.0–47.0)
HGB: 12.6 g/dL (ref 12.0–16.0)
MCH: 32.2 pg (ref 26.0–34.0)
MCHC: 33.1 g/dL (ref 32.0–36.0)
MCV: 97 fL (ref 80–100)
PLATELETS: 151 10*3/uL (ref 150–440)
RBC: 3.92 10*6/uL (ref 3.80–5.20)
RDW: 16.3 % — ABNORMAL HIGH (ref 11.5–14.5)
WBC: 4.7 10*3/uL (ref 3.6–11.0)

## 2013-08-21 DEATH — deceased

## 2014-09-14 NOTE — H&P (Signed)
PATIENT NAME:  Erin Reese, COLLEDGE MR#:  [id number] DATE OF BIRTH:  19-Dec-1930  DATE OF ADMISSION:  06/05/2011  REFERRING PHYSICIAN: Dr. Adela Glimpse FAMILY PHYSICIAN: Dr. Sullivan Lone  REASON FOR ADMISSION: Acute respiratory failure with hemoptysis.   HISTORY OF PRESENT ILLNESS: The patient is a 81 year old female followed by Dr. Sullivan Lone with a history of chronic obstructive pulmonary disease, coronary artery disease status post CABG, benign hypertension, and early dementia. Presents to the Emergency Room with acute onset of shortness of breath and cough. Had hemoptysis last night. In the Emergency Room, the patient was found to be hypotensive and hypoxic. She was started on oxygen. In the Emergency Room, she was given SVNs with IV steroids and IV antibiotics. Initial chest x-ray was nondiagnostic. She is now admitted for further evaluation. Patient states that she quit smoking over 10 years ago.   PAST MEDICAL HISTORY:  1. Chronic obstructive pulmonary disease with remote history of tobacco abuse.  2. Atherosclerotic cardiovascular disease status post MI.  3. Status post coronary artery bypass graft. 4. Ischemic cardiomyopathy.  5. Benign hypertension.  6. Depression.  7. Early dementia.  8. Chronic palpitations.  9. Osteoarthritis.  10. Gastroesophageal reflux disease.   MEDICATIONS:  1. Zoloft 100 mg p.o. daily.  2. Toprol-XL 100 mg p.o. daily. 3. Nitro-Dur 0.8 mg/h topically daily. 4. Aspirin 325 mg p.o. daily.  5. Nexium 40 mg p.o. daily.  6. Lasix 20 mg p.o. daily.  7. Trazodone 50 mg p.o. at bedtime.  8. MiraLax 17 grams p.o. daily.  9. Norco 5/325, 1 to 2 p.o. every six hours p.r.n. pain.  10. Zestril 5 mg p.o. daily.  11. Claritin 10 mg p.o. daily.  12. Klonopin 0.5 mg p.o. 3 times daily.  13. Flonase 2 puffs in each nostril daily.  14. Aricept 5 mg p.o. daily.  15. Zocor 10 mg p.o. daily.  16. Lyrica 50 mg p.o. b.i.d.  17. BuSpar 15 mg p.o. b.i.d.  18. Ventolin 2 puffs q.4-6h.  p.r.n. shortness of breath.   ALLERGIES: Sulfa, codeine, penicillin, Relafen, Lodine, and E-Mycin.   SOCIAL HISTORY: Patient quit smoking in 1990. No history of alcohol abuse.   FAMILY HISTORY: Positive for hypertension, coronary artery disease, and stroke.    REVIEW OF SYSTEMS: CONSTITUTIONAL: No fever or change in weight. EYES: No blurred or double vision. No glaucoma. ENT: No tinnitus or hearing loss. No nasal discharge or bleeding. No difficulty swallowing. RESPIRATORY: No painful respiration. No history of tuberculosis. CARDIOVASCULAR: No chest pain or orthopnea. No syncope. GASTROINTESTINAL: No nausea, vomiting, or diarrhea. No abdominal pain. No change in bowel habits. GENITOURINARY: No dysuria, hematuria. No incontinence. ENDOCRINE: No polyuria or polydipsia. No heat or cold intolerance. HEMATOLOGIC: Patient denies anemia, easy bruising. LYMPHATIC: No swollen glands. MUSCULOSKELETAL: Patient denies pain in her neck, back, shoulders, knees, or hips. No gout. NEUROLOGIC: No numbness or migraines. Denies stroke or seizures. PSYCH: Patient denies anxiety, insomnia, or depression.   PHYSICAL EXAMINATION:  GENERAL: Patient is chronically ill-appearing, in moderate respiratory distress.   VITAL SIGNS: Vital signs are currently remarkable for a blood pressure of 97/52 with a heart rate of 76 and a respiratory rate of 18. She is afebrile.   HEENT: Normocephalic, atraumatic. Pupils equally round and reactive to light and accommodation. Extraocular movements are intact. Sclerae are not icteric. Conjunctivae are clear. Oropharynx is dry but clear.   NECK: Supple without jugular venous distention or bruits. No adenopathy or thyromegaly is noted.   LUNGS: Scattered wheezes and rhonchi.  No dullness. No rales. Respiratory effort is mildly increased.   CARDIAC: Regular rate and rhythm. Normal S1 and S2. No significant rubs, murmurs, or gallops. PMI is nondisplaced. Chest wall is nontender.   ABDOMEN:  Soft, nontender, and normoactive bowel sounds. No organomegaly, masses were appreciated. No hernias or bruits were noted.   EXTREMITIES: Without clubbing, cyanosis, edema. Pulses were 1+ bilaterally.   SKIN: Warm and dry without rash or lesions.   NEUROLOGIC: Cranial nerves II through XII grossly intact. Deep tendon reflexes were symmetric. Motor and sensory examination is nonfocal.   PSYCH: Exam revealed a patient who was alert and oriented to person, place, and time. She was cooperative and used good judgment.   LABORATORY, DIAGNOSTIC AND RADIOLOGICAL DATA: EKG revealed sinus rhythm with no acute ischemic changes. CBC revealed a white count of 4.1 with a hemoglobin of 13.3. Troponin was less than 0.02. Glucose 115 with a BUN of 10 and a creatinine of 0.85 with a sodium of 144 and a potassium of 3.7. Chest x-ray revealed bilateral small pleural effusions with no obvious infiltrate or edema. Pulse oximetry on room air is equal to 88%.   ASSESSMENT:  1. Acute respiratory failure with hypoxia.  2. Chronic obstructive pulmonary disease exacerbation.  3. Presumed bronchitis.  4. Anemia of chronic disease.  5. Atherosclerotic cardiovascular disease status post coronary artery bypass graft.  6. Ischemic cardiomyopathy.   7. Benign hypertension.   PLAN: Patient will be admitted to the floor with telemetry. Will supplement oxygen at this time and wean as tolerated. Begin IV steroids with IV antibiotics and Xopenex and Atrovent SVNs. Will continue her cardiac medications she has been taking as an outpatient. Will follow serial cardiac enzymes. Will obtain a chest CT because of her hypoxia and shortness of breath as well as her hemoptysis. Follow up routine chest x-ray and labs in the morning. Will consult Dr. Meredeth IdeFleming who has seen her in the past for her underlying lung disease. Will also consult physical therapy and case management to begin discharge planning. Will follow her sugars with Accu-Cheks  before meals and at bedtime and add sliding scale insulin as needed while on steroids. Further treatment and evaluation will depend upon the patient's progress.   TOTAL TIME SPENT ON THIS PATIENT: 50 minutes.   ____________________________ Duane LopeJeffrey D. Judithann SheenSparks, MD jds:cms D: 06/05/2011 17:16:11 ET T: 06/06/2011 06:06:53 ET JOB#: [id number]  cc: Duane LopeJeffrey D. Judithann SheenSparks, MD, <Dictator> Richard L. Sullivan LoneGilbert, MD Jonathandavid Marlett Rodena Medin Miniya Miguez MD ELECTRONICALLY SIGNED 06/06/2011 11:37

## 2014-09-14 NOTE — Discharge Summary (Signed)
PATIENT NAME:  Erin Reese, Erin Reese MR#:  [id number] DATE OF BIRTH:  [birth year]  DATE OF ADMISSION:  06/05/2011 DATE OF DISCHARGE:  06/08/2011  ADMITTING DIAGNOSIS: Acute respiratory failure.   DISCHARGE DIAGNOSES:  1. Acute respiratory failure. 2. Chronic obstructive pulmonary disease exacerbation.  3. Acute bronchitis. 4. Hemoptysis due to bronchitis.  5. Anemia.  6. Hyperglycemia with Hemoglobin A1c 6.1 concerning for prediabetes.  7. History of coronary artery disease, status post coronary artery bypass grafting, stable.  8. Ischemic cardiomyopathy, stable. 9. Hypertension.  10. Anxiety and depression. 11. Dementia. 12. Osteoarthritis. 13. Gastroesophageal reflux disease.   DISCHARGE CONDITION: Stable.  DISCHARGE MEDICATIONS: The patient is to resume her outpatient medications which include the following. 1. Loratadine 10 mg hours p.o. daily.  2. Donepezil 5 mg p.o. at bedtime.  3. Citrucel 500 mg p.o. daily.  4. Lasix 20 mg 1/2 tablet once daily. 5. Align 4 mg p.o. daily. 6. Flonase 50 mcg inhalation nasal spray one spray to each nostril daily. 7. Lisinopril 5 mg p.o. daily.  8. Metoprolol succinate 100 mg p.o. daily.  9. Multivitamins with iron once daily.  10. Lyrica 50 mg p.o. twice daily. 11. Buspirone 15 mg twice daily. 12. Tylenol extra-strength 500 mg p.o. three times daily as needed.  13. Systane preservative-free ophthalmic solution one drop to each affected eye four times daily.  14. Simvastatin 10 mg p.o. at bedtime.  15. Trazodone 50 mg 1/2 tablet orally at nighttime one hour after the patient'Reese scheduled dose if the patient is still awake.  16. Ipratropium nasal spray two sprays to nostrils twice daily as needed.  17. Atrovent two sprays to each nostril twice daily as needed.  18. Norco 5/325 mg one tablet every six hours as needed.  19. Zoloft 100 mg p.o. daily.  20. Clonazepam 0.5 mg 1/2 tablet three times daily. 21. Trazodone 50 mg p.o. at bedtime.    ADDITIONAL MEDICATIONS:  1. Prednisone 60 mg p.o. once on 06/09/2011 then taper x10 mg every two days until stopped.  2. Levaquin 500 mg p.o. daily for four more days.  3. Mucinex 1200 mg p.o. twice daily.  4. MiraLax 17 grams p.o. daily.  5. Mylanta DS 30 mL every six hours as needed.  6. Nexium 40 mg p.o. twice daily.  7. Nitroglycerin patch 0.4 mg topically daily.  8. Advair Diskus 250/50 one puff twice daily.  9. Aspirin 81 mg p.o. daily.  10. Combivent 2 puffs every four hours as needed.  11. DuoNebs 3 mL every four hours as needed via nebulizer machine. A prescription for nebulizer machine was also given.   HOME OXYGEN: Portable tank with 2 liters of oxygen through nasal cannula continuously.   DIET: 2 grams salt, 1800 ADA, low fat, low cholesterol.   ACTIVITY LIMITATIONS: As tolerated.   DISCHARGE FOLLOWUP: Followup with Dr. Sullivan LoneGilbert in two days after discharge and with Dr. Ned ClinesHerbon Fleming in one week after discharge.  CONSULTANTS:  1. Care Management.  2. Ned ClinesHerbon Fleming, MD  RADIOLOGIC STUDIES: Chest portable single view on 06/05/2011: No acute disease of the chest.   Chest X-ray, PA and lateral, on 06/06/2011: No acute disease of the chest.  CT of the chest for pulmonary embolism with contrast, on 06/05/2011: No pulmonary embolus identified. There are two focal dissections versus large ulcerated plaques in the inferior descending thoracic aorta which are similar to prior. Subtle scattered central lobular nodular opacities that are nonspecific but differential would include atypical mycobacterial infection. Followup with noncontrast  chest CT is suggested in three months. Indeterminant pulmonary nodules which are 4 mm or less. Recommend attention with the aforementioned follow-up chest CT. If these persist, followup to ensure at least one year stability would be recommended. There is a 7 mm nodular density in subpleural left upper lobe likely secondary to atelectasis or  scarring. However, this recommends on the followup chest CT in three months.  HISTORY OF PRESENT ILLNESS: The patient is a 81 year old female with past medical history significant for history of chronic obstructive pulmonary disease and history of tobacco abuse who presented to the hospital with complaints of shortness of breath and coughing and having hemoptysis. The patient was also found to be hypotensive as well as hypoxic in the emergency room. Chest x-ray was unremarkable. Initial vital signs showed a blood pressure of 97/52, heart rate of 76, respiration was 18, and the patient was afebrile. Physical exam revealed scattered wheezes as well as rhonchi on lung examination. Respiratory rate was increased. Otherwise, physical examination was unremarkable.   LABS/STUDIES: On 06/05/2011 glucose was 115, otherwise BMP was unremarkable. The patient'Reese liver enzymes were normal. Cardiac enzymes first set as well as subsequent three more sets were within normal limits. The patient'Reese CBC showed white blood cell count of 4.1, hemoglobin 11.3, and platelets 187.   Blood cultures x2 did not show any growth.   Influenza test was negative.   ABGs were done on 21% FiO2 and showed a pH of 7.35, pCO2 52, and pO2 was not done.  EKG showed normal sinus rhythm at rate of 67 beats per minute, normal axis, and no acute ST-T changes were noted. No significant change since prior study done in 01/2011.   HOSPITAL COURSE: The patient was admitted to the hospital. She was started on antibiotic therapy as well as oxygen inhalation therapy and steroids IV. With this therapy, she significantly improved. She was evaluated by pulmonologist, Dr. Ned Clines, who recommended to continue this therapy. Although the patient'Reese sputum cultures were not received, she improved clinically. The patient is to continue antibiotic therapy for four more days as well as steroid taper and inhalation therapy. She is to follow up with her  primary care physician as well as Dr. Meredeth Ide as an outpatient for further recommendations. She is to have a CT scan of her chest repeated in the next three months after discharge. She was evaluated for oxygen need and she was felt to need oxygen therapy, especially on exertion. Her oxygen saturation was found to be 90% on room air at rest, however, it deteriorated down to 86% on exertion on room air. It improved on oxygen therapy and remained stable on 2 liters of oxygen through nasal cannula on exertion at 92%. The patient is to continue oxygen therapy, on exertion as well as at bedtime and whenever she feels short of breath.   As mentioned above, she was evaluated for pulmonary embolism and that was not found. It was felt that the patient'Reese COPD exacerbation was related to acute bronchitis, questionable pneumonitis.   The patient was noted to be hyperglycemic on steroids. Her hemoglobin A1c was checked and was found to be 6.1. This elevation was concerning for possible to prediabetes, however, no formal testing, such as glucose tolerance test for diabetes, was performed due to use of steroids. It is recommended to follow the patient as an outpatient and get glucose tolerance test as an outpatient to rule out diabetes mellitus and treat her accordingly.  For history of coronary artery  disease, the patient'Reese coronary artery disease was stable. Her cardiac enzymes were checked x4 and they were negative for injury.   For hypertension, the patient is to continue outpatient medications.   For anxiety and depression, she is to continue her outpatient medications. No other recommendations were made.  For history of osteoarthritis as well as gastroesophageal reflux disease, the patient is to continue her outpatient medications.   The patient was noted to have constipation while she was in the hospital. She required medications for constipation. She is going to be given a MiraLax prescription. She will also  be given a prescription for nebulizer machine and DuoNebs.   She is being discharged in stable condition with the above-mentioned medications and follow-up. On the day of discharge, her temperature was 98.3, pulse 73, respiration rate 20, blood pressure 156/71, and saturation 91% on room air at rest, however, saturation was 92% on 2 liters of oxygen through nasal cannula on exertion.   TIME SPENT: 40 minutes. ____________________________ Katharina Caper, MD rv:slb D: 06/08/2011 19:58:06 ET T: 06/09/2011 13:35:04 ET JOB#: [id number]  cc: Katharina Caper, MD, <Dictator> Richard L. Sullivan Lone, MD Katharina Caper MD ELECTRONICALLY SIGNED 06/20/2011 7:49
